# Patient Record
Sex: Female | Born: 1965 | Race: White | Hispanic: No | Marital: Married | Smoking: Never smoker
Health system: Southern US, Community
[De-identification: ages and names within clinical notes are randomized; demographics above are authoritative.]

## PROBLEM LIST (undated history)

## (undated) DIAGNOSIS — K429 Umbilical hernia without obstruction or gangrene: Secondary | ICD-10-CM

## (undated) DIAGNOSIS — K649 Unspecified hemorrhoids: Secondary | ICD-10-CM

## (undated) DIAGNOSIS — R519 Headache, unspecified: Secondary | ICD-10-CM

## (undated) HISTORY — PX: UMBILICAL HERNIA REPAIR: SHX196

## (undated) HISTORY — PX: TUBAL LIGATION: SHX77

---

## 2007-05-18 ENCOUNTER — Ambulatory Visit: Payer: Self-pay | Admitting: Obstetrics and Gynecology

## 2011-05-24 ENCOUNTER — Ambulatory Visit: Payer: Self-pay | Admitting: Obstetrics and Gynecology

## 2012-10-29 ENCOUNTER — Ambulatory Visit: Payer: Self-pay | Admitting: Neurology

## 2015-03-31 ENCOUNTER — Other Ambulatory Visit: Payer: Self-pay | Admitting: Obstetrics and Gynecology

## 2015-03-31 DIAGNOSIS — Z1231 Encounter for screening mammogram for malignant neoplasm of breast: Secondary | ICD-10-CM

## 2015-04-10 ENCOUNTER — Ambulatory Visit
Admission: RE | Admit: 2015-04-10 | Discharge: 2015-04-10 | Disposition: A | Discharge status: Home / Self Care | Payer: BLUE CROSS/BLUE SHIELD | Source: Ambulatory Visit | Attending: Obstetrics and Gynecology | Admitting: Obstetrics and Gynecology

## 2015-04-10 DIAGNOSIS — Z1231 Encounter for screening mammogram for malignant neoplasm of breast: Secondary | ICD-10-CM | POA: Diagnosis present

## 2016-04-26 ENCOUNTER — Other Ambulatory Visit: Payer: Self-pay | Admitting: Obstetrics and Gynecology

## 2016-04-26 DIAGNOSIS — Z1231 Encounter for screening mammogram for malignant neoplasm of breast: Secondary | ICD-10-CM

## 2016-05-20 ENCOUNTER — Ambulatory Visit
Admission: RE | Admit: 2016-05-20 | Discharge: 2016-05-20 | Disposition: A | Discharge status: Home / Self Care | Payer: BLUE CROSS/BLUE SHIELD | Source: Ambulatory Visit | Attending: Obstetrics and Gynecology | Admitting: Obstetrics and Gynecology

## 2016-05-20 DIAGNOSIS — Z1231 Encounter for screening mammogram for malignant neoplasm of breast: Secondary | ICD-10-CM | POA: Insufficient documentation

## 2017-08-11 ENCOUNTER — Other Ambulatory Visit: Payer: Self-pay | Admitting: Obstetrics and Gynecology

## 2017-08-11 DIAGNOSIS — Z1231 Encounter for screening mammogram for malignant neoplasm of breast: Secondary | ICD-10-CM

## 2017-08-24 ENCOUNTER — Ambulatory Visit
Admission: RE | Admit: 2017-08-24 | Discharge: 2017-08-24 | Disposition: A | Discharge status: Home / Self Care | Payer: BLUE CROSS/BLUE SHIELD | Source: Ambulatory Visit | Attending: Obstetrics and Gynecology | Admitting: Obstetrics and Gynecology

## 2017-08-24 DIAGNOSIS — Z1231 Encounter for screening mammogram for malignant neoplasm of breast: Secondary | ICD-10-CM | POA: Diagnosis not present

## 2018-10-30 ENCOUNTER — Other Ambulatory Visit: Payer: Self-pay | Admitting: Obstetrics and Gynecology

## 2019-05-13 ENCOUNTER — Ambulatory Visit
Admission: RE | Admit: 2019-05-13 | Discharge: 2019-05-13 | Disposition: A | Discharge status: Home / Self Care | Payer: BLUE CROSS/BLUE SHIELD | Source: Ambulatory Visit | Attending: Chiropractor | Admitting: Chiropractor

## 2019-05-13 ENCOUNTER — Ambulatory Visit
Admission: RE | Admit: 2019-05-13 | Discharge: 2019-05-13 | Disposition: A | Discharge status: Home / Self Care | Payer: BLUE CROSS/BLUE SHIELD | Attending: Chiropractor | Admitting: Chiropractor

## 2019-05-13 ENCOUNTER — Other Ambulatory Visit: Payer: Self-pay | Admitting: Chiropractor

## 2019-05-13 DIAGNOSIS — S134XXA Sprain of ligaments of cervical spine, initial encounter: Secondary | ICD-10-CM

## 2019-05-13 DIAGNOSIS — S233XXA Sprain of ligaments of thoracic spine, initial encounter: Secondary | ICD-10-CM | POA: Diagnosis present

## 2019-06-19 ENCOUNTER — Other Ambulatory Visit: Payer: Self-pay | Admitting: Family Medicine

## 2019-06-19 DIAGNOSIS — Z1231 Encounter for screening mammogram for malignant neoplasm of breast: Secondary | ICD-10-CM

## 2019-07-10 ENCOUNTER — Ambulatory Visit
Admission: RE | Admit: 2019-07-10 | Discharge: 2019-07-10 | Disposition: A | Discharge status: Home / Self Care | Payer: BLUE CROSS/BLUE SHIELD | Source: Ambulatory Visit | Attending: Family Medicine | Admitting: Family Medicine

## 2019-07-10 DIAGNOSIS — Z1231 Encounter for screening mammogram for malignant neoplasm of breast: Secondary | ICD-10-CM | POA: Diagnosis present

## 2019-09-19 DIAGNOSIS — Z1211 Encounter for screening for malignant neoplasm of colon: Secondary | ICD-10-CM | POA: Diagnosis not present

## 2019-09-19 DIAGNOSIS — Z01818 Encounter for other preprocedural examination: Secondary | ICD-10-CM | POA: Diagnosis not present

## 2019-10-16 DIAGNOSIS — Z1159 Encounter for screening for other viral diseases: Secondary | ICD-10-CM | POA: Diagnosis not present

## 2019-10-16 DIAGNOSIS — Z03818 Encounter for observation for suspected exposure to other biological agents ruled out: Secondary | ICD-10-CM | POA: Diagnosis not present

## 2020-01-06 ENCOUNTER — Other Ambulatory Visit
Admission: RE | Admit: 2020-01-06 | Discharge: 2020-01-06 | Disposition: A | Discharge status: Home / Self Care | Payer: 59 | Source: Ambulatory Visit | Attending: Internal Medicine | Admitting: Internal Medicine

## 2020-01-06 ENCOUNTER — Other Ambulatory Visit: Payer: Self-pay

## 2020-01-06 DIAGNOSIS — Z01812 Encounter for preprocedural laboratory examination: Secondary | ICD-10-CM | POA: Diagnosis present

## 2020-01-06 DIAGNOSIS — Z20822 Contact with and (suspected) exposure to covid-19: Secondary | ICD-10-CM | POA: Diagnosis not present

## 2020-01-06 LAB — SARS CORONAVIRUS 2 (TAT 6-24 HRS): SARS Coronavirus 2: NEGATIVE

## 2020-01-07 ENCOUNTER — Encounter: Payer: Self-pay | Admitting: Internal Medicine

## 2020-01-08 ENCOUNTER — Ambulatory Visit: Payer: 59 | Admitting: Anesthesiology

## 2020-01-08 ENCOUNTER — Other Ambulatory Visit: Payer: Self-pay

## 2020-01-08 ENCOUNTER — Encounter: Payer: Self-pay | Admitting: Internal Medicine

## 2020-01-08 ENCOUNTER — Encounter
Admission: RE | Disposition: A | Discharge status: Home / Self Care | Payer: Self-pay | Source: Home / Self Care | Attending: Internal Medicine

## 2020-01-08 ENCOUNTER — Ambulatory Visit
Admission: RE | Admit: 2020-01-08 | Discharge: 2020-01-08 | Disposition: A | Discharge status: Home / Self Care | Payer: 59 | Attending: Internal Medicine | Admitting: Internal Medicine

## 2020-01-08 DIAGNOSIS — Z1211 Encounter for screening for malignant neoplasm of colon: Secondary | ICD-10-CM | POA: Diagnosis not present

## 2020-01-08 DIAGNOSIS — K641 Second degree hemorrhoids: Secondary | ICD-10-CM | POA: Diagnosis not present

## 2020-01-08 DIAGNOSIS — K642 Third degree hemorrhoids: Secondary | ICD-10-CM | POA: Insufficient documentation

## 2020-01-08 HISTORY — DX: Umbilical hernia without obstruction or gangrene: K42.9

## 2020-01-08 HISTORY — PX: COLONOSCOPY: SHX5424

## 2020-01-08 HISTORY — DX: Unspecified hemorrhoids: K64.9

## 2020-01-08 HISTORY — DX: Headache, unspecified: R51.9

## 2020-01-08 LAB — POCT PREGNANCY, URINE: Preg Test, Ur: NEGATIVE

## 2020-01-08 SURGERY — COLONOSCOPY
Anesthesia: General

## 2020-01-08 MED ORDER — PROPOFOL 500 MG/50ML IV EMUL
INTRAVENOUS | Status: DC | PRN
  Administered 2020-01-08: 175 ug/kg/min via INTRAVENOUS

## 2020-01-08 MED ORDER — GLYCOPYRROLATE 0.2 MG/ML IJ SOLN
INTRAMUSCULAR | Status: AC
  Filled 2020-01-08: qty 2

## 2020-01-08 MED ORDER — LIDOCAINE HCL (CARDIAC) PF 100 MG/5ML IV SOSY
PREFILLED_SYRINGE | INTRAVENOUS | Status: DC | PRN
  Administered 2020-01-08: 100 mg via INTRAVENOUS

## 2020-01-08 MED ORDER — MIDAZOLAM HCL 2 MG/2ML IJ SOLN
INTRAMUSCULAR | Status: AC
  Filled 2020-01-08: qty 2

## 2020-01-08 MED ORDER — PHENYLEPHRINE HCL (PRESSORS) 10 MG/ML IV SOLN
INTRAVENOUS | Status: AC
  Filled 2020-01-08: qty 1

## 2020-01-08 MED ORDER — PHENYLEPHRINE HCL (PRESSORS) 10 MG/ML IV SOLN
INTRAVENOUS | Status: DC | PRN
  Administered 2020-01-08: 100 ug via INTRAVENOUS

## 2020-01-08 MED ORDER — SODIUM CHLORIDE 0.9 % IV SOLN: INTRAVENOUS | Status: DC

## 2020-01-08 MED ORDER — PROPOFOL 10 MG/ML IV BOLUS
INTRAVENOUS | Status: DC | PRN
  Administered 2020-01-08: 60 mg via INTRAVENOUS
  Administered 2020-01-08: 10 mg via INTRAVENOUS

## 2020-01-08 MED ORDER — LIDOCAINE HCL (PF) 2 % IJ SOLN
INTRAMUSCULAR | Status: AC
  Filled 2020-01-08: qty 40

## 2020-01-08 NOTE — Anesthesia Procedure Notes (Signed)
Procedure Name: General with mask airway Performed by: Fletcher-Harrison, Mylisa Brunson, CRNA Pre-anesthesia Checklist: Patient identified, Emergency Drugs available, Suction available and Patient being monitored Patient Re-evaluated:Patient Re-evaluated prior to induction Oxygen Delivery Method: Simple face mask Induction Type: IV induction Placement Confirmation: positive ETCO2 and CO2 detector Dental Injury: Teeth and Oropharynx as per pre-operative assessment        

## 2020-01-08 NOTE — Op Note (Signed)
[facility] Gastroenterology Patient Name: Carla Goodman Procedure Date: 01/08/2020 8:36 AM MRN: [id number] Account #: [id number] Date of Birth: 12-18-1965 Admit Type: Outpatient Age: 54 Room: [facility] ENDO ROOM 4 Gender: Female Note Status: Finalized Procedure:             Colonoscopy Indications:           Screening for colorectal malignant neoplasm Providers:             Boykin Nearing. Norma Fredrickson MD, MD Referring MD:          Rhona Leavens. Burnett Sheng, MD (Referring MD) Medicines:             Propofol per Anesthesia Complications:         No immediate complications. Procedure:             Pre-Anesthesia Assessment:                        - The risks and benefits of the procedure and the                         sedation options and risks were discussed with the                         patient. All questions were answered and informed                         consent was obtained.                        - Patient identification and proposed procedure were                         verified prior to the procedure by the nurse. The                         procedure was verified in the procedure room.                        - ASA Grade Assessment: II - A patient with mild                         systemic disease.                        - After reviewing the risks and benefits, the patient                         was deemed in satisfactory condition to undergo the                         procedure.                        After obtaining informed consent, the colonoscope was                         passed under direct vision. Throughout the procedure,                         the patient's blood pressure,  pulse, and oxygen                         saturations were monitored continuously. The                         Colonoscope was introduced through the anus and                         advanced to the the cecum, identified by appendiceal                         orifice and ileocecal valve. The  colonoscopy was                         performed without difficulty. The patient tolerated                         the procedure well. The quality of the bowel                         preparation was good. The ileocecal valve, appendiceal                         orifice, and rectum were photographed. Findings:      The colon (entire examined portion) appeared normal.      The perianal exam findings include internal hemorrhoids that prolapse       with straining, but require manual replacement into the anal canal       (Grade III).      Non-bleeding internal hemorrhoids were found during retroflexion. The       hemorrhoids were Grade II (internal hemorrhoids that prolapse but reduce       spontaneously). Impression:            - The entire examined colon is normal.                        - Internal hemorrhoids that prolapse with straining,                         but require manual replacement into the anal canal                         (Grade III) found on perianal exam.                        - Non-bleeding internal hemorrhoids.                        - No specimens collected. Recommendation:        - Patient has a contact number available for                         emergencies. The signs and symptoms of potential                         delayed complications were discussed with the patient.  Return to normal activities tomorrow. Written                         discharge instructions were provided to the patient.                        - Resume previous diet.                        - Continue present medications.                        - Repeat colonoscopy in 10 years for screening                         purposes.                        - Return to GI office PRN.                        - The findings and recommendations were discussed with                         the patient. Procedure Code(s):     --- Professional ---                        W5462,  Colorectal cancer screening; colonoscopy on                         individual not meeting criteria for high risk Diagnosis Code(s):     --- Professional ---                        K64.2, Third degree hemorrhoids                        Z12.11, Encounter for screening for malignant neoplasm                         of colon CPT copyright 2019 [employer]. All rights reserved. The codes documented in this report are preliminary and upon coder review may  be revised to meet current compliance requirements. Stanton Kidney MD, MD 01/08/2020 9:31:04 AM This report has been signed electronically. Number of Addenda: 0 Note Initiated On: 01/08/2020 8:36 AM Scope Withdrawal Time: 0 hours 9 minutes 4 seconds  Total Procedure Duration: 0 hours 14 minutes 11 seconds  Estimated Blood Loss:  Estimated blood loss: none.      [facility]

## 2020-01-08 NOTE — H&P (Signed)
Outpatient short stay form Pre-procedure 01/08/2020 9:05 AM Carla Goodman K. Carla Goodman, M.D.  Primary Physician: Carla Goodman, M.D.  Reason for visit:  Colon cancer screening  History of present illness: Patient presents for colonoscopy for colon cancer screening. The patient denies complaints of abdominal pain, significant change in bowel habits, or rectal bleeding.      Current Facility-Administered Medications:  .  0.9 %  sodium chloride infusion, , Intravenous, Continuous, Carla Goodman, Carla Nearing, MD, Last Rate: 20 mL/hr at 01/08/20 2585, New Bag at 01/08/20 2778  Medications Prior to Admission  Medication Sig Dispense Refill Last Dose  . Biotin 1000 MCG CHEW Chew by mouth.   01/06/2020  . Biotin 1000 MCG CHEW Chew 1,000 mg by mouth.     . cetirizine (ZYRTEC) 10 MG tablet Take 10 mg by mouth daily.   01/06/20  . glucosamine-chondroitin 500-400 MG tablet Take 2 tablets by mouth daily.     . Omega-3 Fatty Acids (FISH OIL BURP-LESS PO) Take 2 capsules by mouth every morning.     Marland Kitchen DOCOSAHEXAENOIC ACID PO Take by mouth. (Patient not taking: Reported on 01/08/2020)   Not Taking at Unknown time     Allergies  Allergen Reactions  . Codeine      Past Medical History:  Diagnosis Date  . Headache   . Hemorrhoids   . Umbilical hernia     Review of systems:  Otherwise negative.    Physical Exam  Gen: Alert, oriented. Appears stated age.  HEENT: Carla Goodman/AT. PERRLA. Lungs: CTA, no wheezes. CV: RR nl S1, S2. Abd: soft, benign, no masses. BS+ Ext: No edema. Pulses 2+    Planned procedures: Proceed with colonoscopy. The patient understands the nature of the planned procedure, indications, risks, alternatives and potential complications including but not limited to bleeding, infection, perforation, damage to internal organs and possible oversedation/side effects from anesthesia. The patient agrees and gives consent to proceed.  Please refer to procedure notes for findings, recommendations and  patient disposition/instructions.     Brees Hounshell K. Carla Goodman, M.D. Gastroenterology 01/08/2020  9:05 AM

## 2020-01-08 NOTE — Anesthesia Postprocedure Evaluation (Signed)
Anesthesia Post Note  Patient: Carla Goodman  Procedure(s) Performed: COLONOSCOPY (N/A )  Patient location during evaluation: Endoscopy Anesthesia Type: General Level of consciousness: awake and alert and oriented Pain management: pain level controlled Vital Signs Assessment: post-procedure vital signs reviewed and stable Respiratory status: spontaneous breathing, nonlabored ventilation and respiratory function stable Cardiovascular status: blood pressure returned to baseline and stable Postop Assessment: no signs of nausea or vomiting Anesthetic complications: no   No complications documented.   Last Vitals:  Vitals:   01/08/20 0940 01/08/20 0950  BP: 111/71   Pulse: 64 63  Resp: 16 14  Temp:    SpO2: 100% 100%    Last Pain:  Vitals:   01/08/20 0940  TempSrc:   PainSc: 0-No pain                 Kellyanne Ellwanger

## 2020-01-08 NOTE — Interval H&P Note (Signed)
History and Physical Interval Note:  01/08/2020 9:06 AM  Carla Goodman  has presented today for surgery, with the diagnosis of COLON CANCER SCREENING.  The various methods of treatment have been discussed with the patient and family. After consideration of risks, benefits and other options for treatment, the patient has consented to  Procedure(s): COLONOSCOPY (N/A) as a surgical intervention.  The patient's history has been reviewed, patient examined, no change in status, stable for surgery.  I have reviewed the patient's chart and labs.  Questions were answered to the patient's satisfaction.     [place], [place]

## 2020-01-08 NOTE — Transfer of Care (Signed)
Immediate Anesthesia Transfer of Care Note  Patient: Carla Goodman  Procedure(s) Performed: COLONOSCOPY (N/A )  Patient Location: Endoscopy Unit  Anesthesia Type:General  Level of Consciousness: drowsy and patient cooperative  Airway & Oxygen Therapy: Patient Spontanous Breathing and Patient connected to face mask oxygen  Post-op Assessment: Report given to RN and Post -op Vital signs reviewed and stable  Post vital signs: Reviewed and stable  Last Vitals:  Vitals Value Taken Time  BP 96/62 01/08/20 0929  Temp 36.6 C 01/08/20 0929  Pulse 68 01/08/20 0931  Resp 16 01/08/20 0931  SpO2 100 % 01/08/20 0931  Vitals shown include unvalidated device data.  Last Pain:  Vitals:   01/08/20 0929  TempSrc:   PainSc: Asleep         Complications: No complications documented.

## 2020-01-08 NOTE — Anesthesia Preprocedure Evaluation (Signed)
Anesthesia Evaluation  Patient identified by MRN, date of birth, ID band Patient awake    Reviewed: Allergy & Precautions, NPO status , Patient's Chart, lab work & pertinent test results  History of Anesthesia Complications Negative for: history of anesthetic complications  Airway Mallampati: II  TM Distance: >3 FB Neck ROM: Full    Dental no notable dental hx.    Pulmonary neg pulmonary ROS, neg sleep apnea, neg COPD,    breath sounds clear to auscultation- rhonchi (-) wheezing      Cardiovascular Exercise Tolerance: Good (-) hypertension(-) CAD, (-) Past MI and (-) Cardiac Stents  Rhythm:Regular Rate:Normal - Systolic murmurs and - Diastolic murmurs    Neuro/Psych  Headaches, neg Seizures negative psych ROS   GI/Hepatic negative GI ROS, Neg liver ROS,   Endo/Other  negative endocrine ROSneg diabetes  Renal/GU negative Renal ROS     Musculoskeletal negative musculoskeletal ROS (+)   Abdominal (+) - obese,   Peds  Hematology negative hematology ROS (+)   Anesthesia Other Findings Past Medical History: No date: Headache No date: Hemorrhoids No date: Umbilical hernia   Reproductive/Obstetrics                             Anesthesia Physical Anesthesia Plan  ASA: I  Anesthesia Plan: General   Post-op Pain Management:    Induction: Intravenous  PONV Risk Score and Plan: 2 and Propofol infusion  Airway Management Planned: Natural Airway  Additional Equipment:   Intra-op Plan:   Post-operative Plan:   Informed Consent: I have reviewed the patients History and Physical, chart, labs and discussed the procedure including the risks, benefits and alternatives for the proposed anesthesia with the patient or authorized representative who has indicated his/her understanding and acceptance.     Dental advisory given  Plan Discussed with: CRNA and Anesthesiologist  Anesthesia Plan  Comments:         Anesthesia Quick Evaluation

## 2020-01-09 ENCOUNTER — Encounter: Payer: Self-pay | Admitting: Internal Medicine

## 2021-02-09 IMAGING — MG DIGITAL SCREENING BILAT W/ TOMO W/ CAD
8 series · 8 of 24 positions shown · non-contrast
Comparison: Previous exam(s).

CLINICAL DATA: Screening.

EXAM:
DIGITAL SCREENING BILATERAL MAMMOGRAM WITH TOMO AND CAD

[L CC synth-2D]
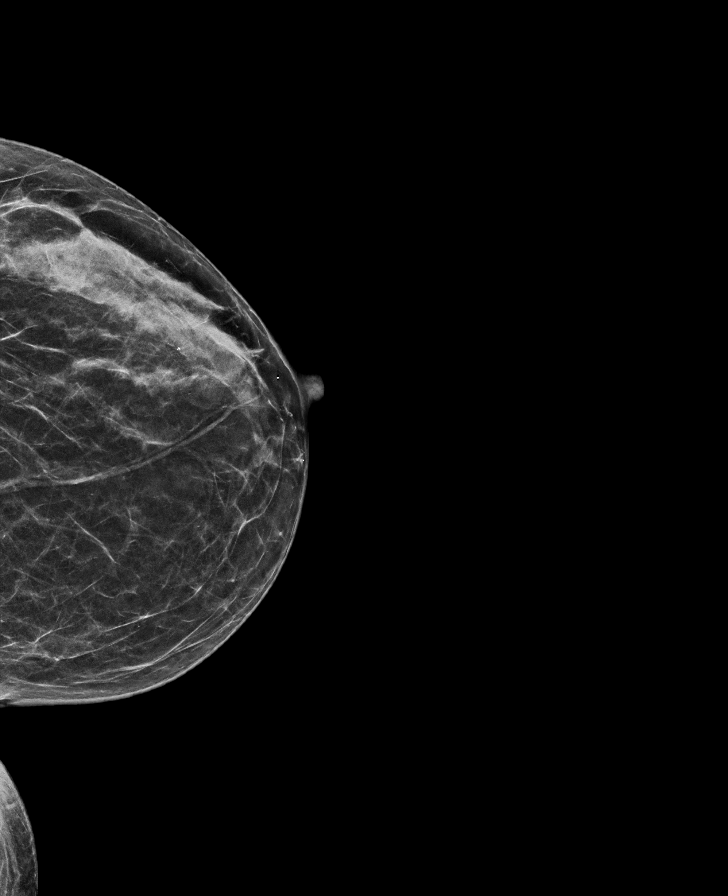

[R CC synth-2D]
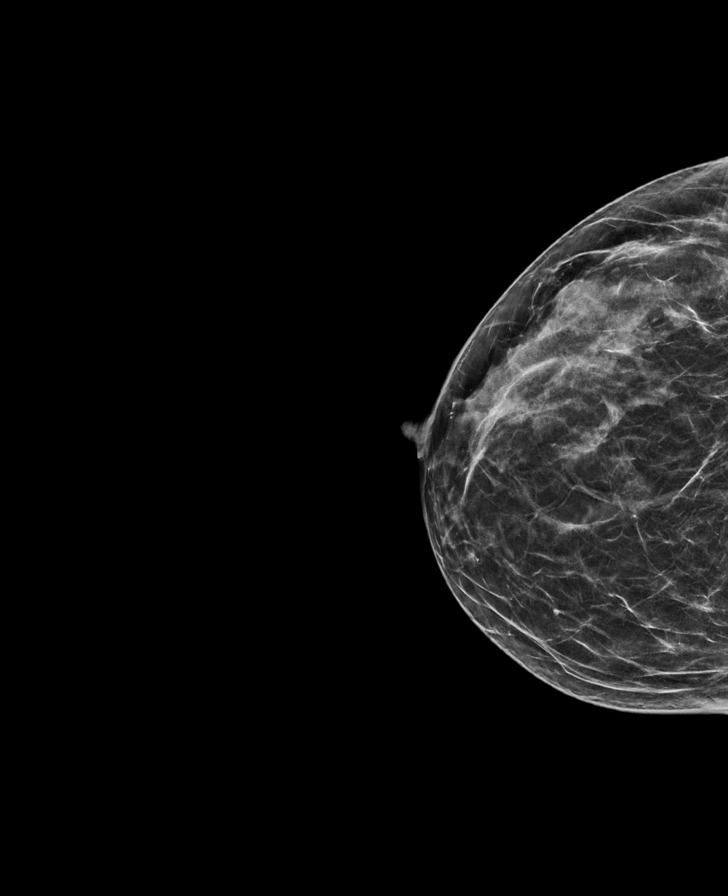

[R MLO synth-2D]
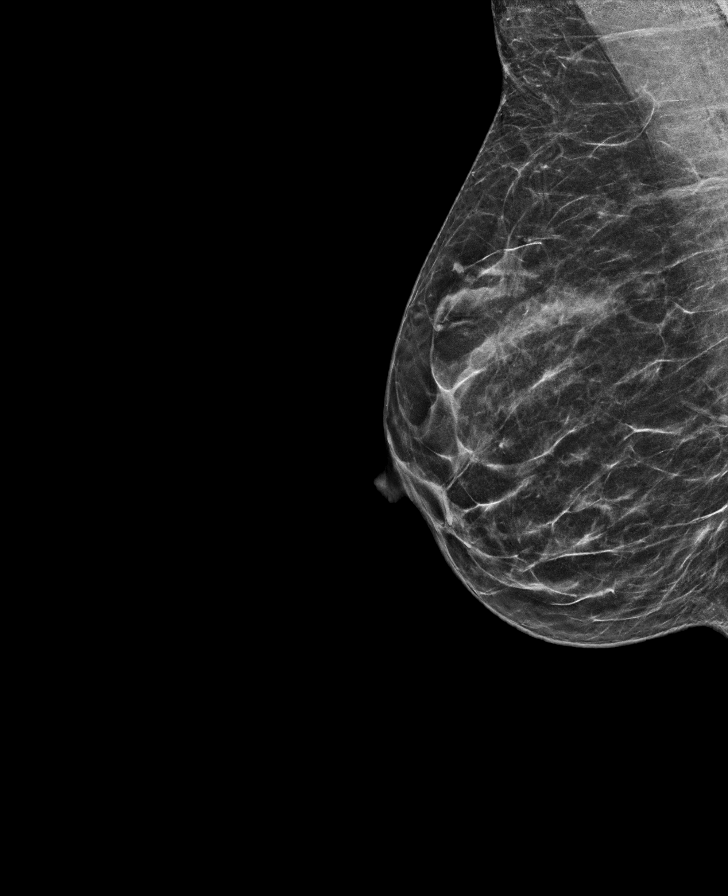

[L MLO synth-2D]
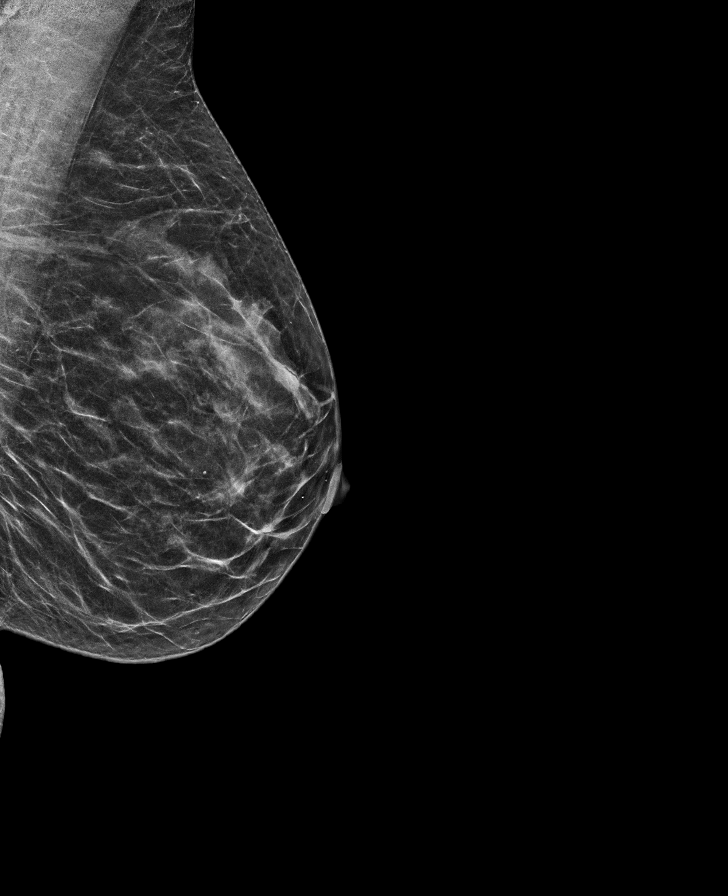

[L CC tomo · tomo slice 25/48.0]
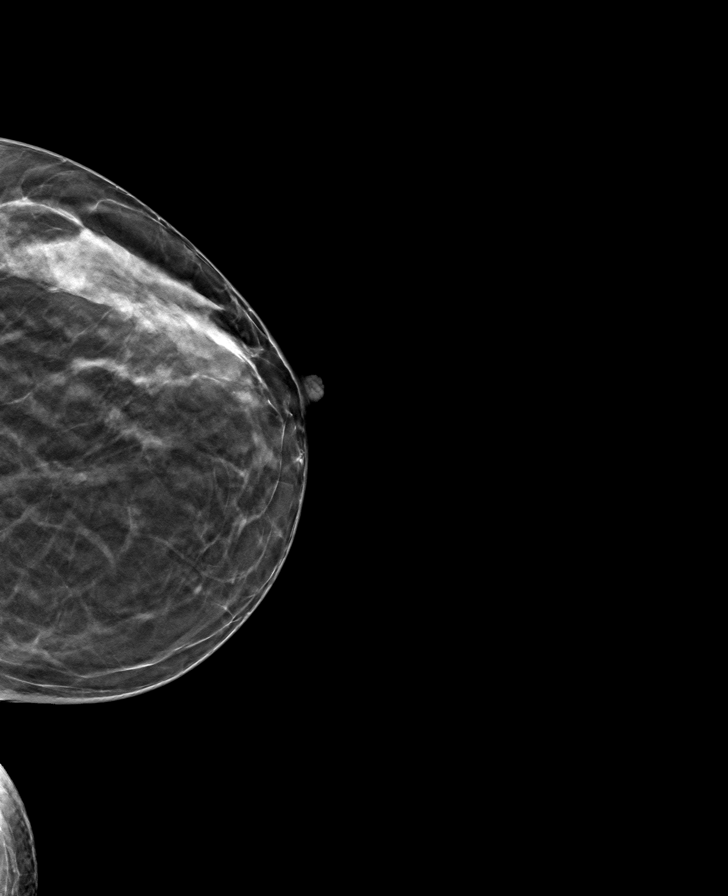

[L MLO tomo · tomo slice 26/51.0]
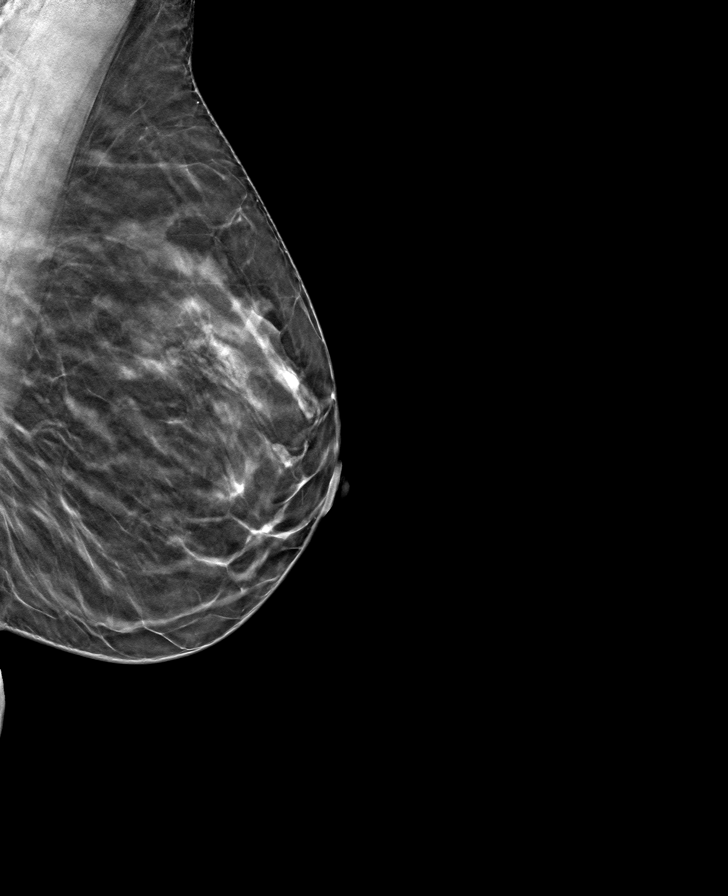

[R MLO tomo · tomo slice 25/49.0]
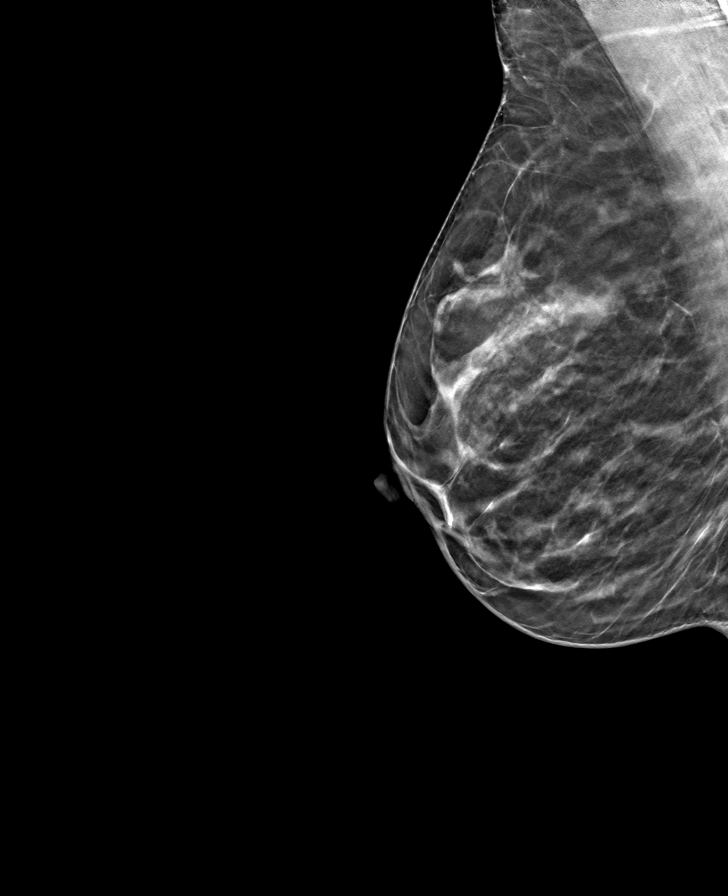

[R CC tomo · tomo slice 27/53.0]
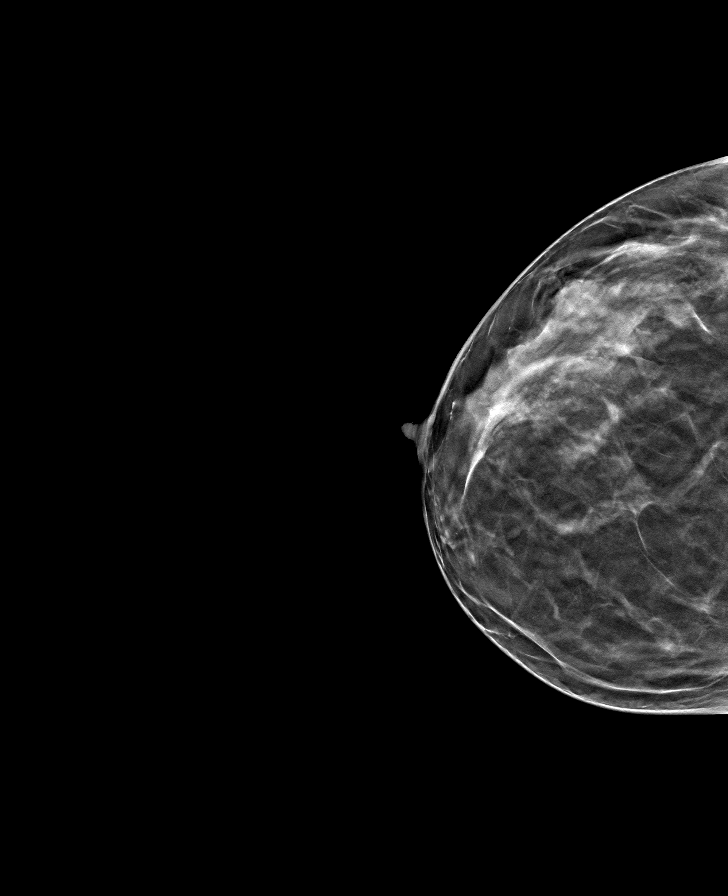

[8 of 24 positions shown; findings below may reference images not displayed]

ACR Breast Density Category b: There are scattered areas of
fibroglandular density.
FINDINGS: There are no findings suspicious for malignancy. Images were
processed with CAD.
IMPRESSION: No mammographic evidence of malignancy. A result letter of this
screening mammogram will be mailed directly to the patient.

RECOMMENDATION:
Screening mammogram in one year. (Code:[id number])

BI-RADS CATEGORY  1: Negative.

## 2021-11-03 DIAGNOSIS — N951 Menopausal and female climacteric states: Secondary | ICD-10-CM | POA: Diagnosis not present

## 2021-11-10 DIAGNOSIS — R232 Flushing: Secondary | ICD-10-CM | POA: Diagnosis not present

## 2021-11-10 DIAGNOSIS — R5383 Other fatigue: Secondary | ICD-10-CM | POA: Diagnosis not present

## 2021-11-10 DIAGNOSIS — Z6824 Body mass index (BMI) 24.0-24.9, adult: Secondary | ICD-10-CM | POA: Diagnosis not present

## 2021-11-10 DIAGNOSIS — N951 Menopausal and female climacteric states: Secondary | ICD-10-CM | POA: Diagnosis not present

## 2021-12-17 DIAGNOSIS — R232 Flushing: Secondary | ICD-10-CM | POA: Diagnosis not present

## 2021-12-17 DIAGNOSIS — N951 Menopausal and female climacteric states: Secondary | ICD-10-CM | POA: Diagnosis not present

## 2021-12-17 DIAGNOSIS — Z6825 Body mass index (BMI) 25.0-25.9, adult: Secondary | ICD-10-CM | POA: Diagnosis not present

## 2021-12-17 DIAGNOSIS — R69 Illness, unspecified: Secondary | ICD-10-CM | POA: Diagnosis not present

## 2022-01-19 ENCOUNTER — Other Ambulatory Visit: Payer: Self-pay | Admitting: Family Medicine

## 2022-01-19 DIAGNOSIS — Z1231 Encounter for screening mammogram for malignant neoplasm of breast: Secondary | ICD-10-CM

## 2022-01-20 ENCOUNTER — Ambulatory Visit
Admission: RE | Admit: 2022-01-20 | Discharge: 2022-01-20 | Disposition: A | Discharge status: Home / Self Care | Payer: 59 | Source: Ambulatory Visit | Attending: Family Medicine | Admitting: Family Medicine

## 2022-01-20 DIAGNOSIS — Z1231 Encounter for screening mammogram for malignant neoplasm of breast: Secondary | ICD-10-CM | POA: Diagnosis not present

## 2022-08-15 DIAGNOSIS — R0789 Other chest pain: Secondary | ICD-10-CM | POA: Diagnosis not present

## 2022-08-15 DIAGNOSIS — Z0000 Encounter for general adult medical examination without abnormal findings: Secondary | ICD-10-CM | POA: Diagnosis not present

## 2022-08-16 DIAGNOSIS — Z0000 Encounter for general adult medical examination without abnormal findings: Secondary | ICD-10-CM | POA: Diagnosis not present

## 2022-10-11 DIAGNOSIS — B001 Herpesviral vesicular dermatitis: Secondary | ICD-10-CM | POA: Diagnosis not present

## 2023-04-11 ENCOUNTER — Other Ambulatory Visit: Payer: Self-pay | Admitting: Family Medicine

## 2023-04-11 DIAGNOSIS — Z1231 Encounter for screening mammogram for malignant neoplasm of breast: Secondary | ICD-10-CM

## 2023-04-18 ENCOUNTER — Ambulatory Visit
Admission: RE | Admit: 2023-04-18 | Discharge: 2023-04-18 | Disposition: A | Discharge status: Home / Self Care | Payer: 59 | Source: Ambulatory Visit | Attending: Family Medicine | Admitting: Family Medicine

## 2023-04-18 DIAGNOSIS — Z1231 Encounter for screening mammogram for malignant neoplasm of breast: Secondary | ICD-10-CM | POA: Diagnosis present
